# Patient Record
Sex: Female | Born: 1965 | Race: White | Hispanic: No | Marital: Married
Health system: Midwestern US, Community
[De-identification: ages and names within clinical notes are randomized; demographics above are authoritative.]

---

## 2017-06-26 ENCOUNTER — Emergency Department: Admit: 2017-06-27 | Payer: MEDICARE

## 2017-06-26 DIAGNOSIS — R109 Unspecified abdominal pain: Secondary | ICD-10-CM

## 2017-06-26 NOTE — ED Provider Notes (Signed)
Triage Chief Complaint:   Flank Pain (Patient to ED with complaints of left flank pain today. )      HOPI:  Helen Austin is a 51 y.o. female that presents with severe left flank pain.  Patient has a history of kidney stones pain came on rather abruptly.  She is nauseous but not vomiting and not febrile.  Patient did note some pink urine.  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  Symptoms began today.    ROS:  Review of systems was reviewed for 10 systems and is otherwise negative except as in the HOPI    History reviewed. No pertinent past medical history.  History reviewed. No pertinent surgical history.  History reviewed. No pertinent family history.  Social History     Social History   . Marital status: Married     Spouse name: N/A   . Number of children: N/A   . Years of education: N/A     Occupational History   . Not on file.     Social History Main Topics   . Smoking status: Never Smoker   . Smokeless tobacco: Never Used   . Alcohol use Not on file   . Drug use: Unknown   . Sexual activity: Not on file     Other Topics Concern   . Not on file     Social History Narrative   . No narrative on file     Current Facility-Administered Medications   Medication Dose Route Frequency Provider Last Rate Last Dose   . HYDROmorphone (DILAUDID) injection 1 mg  1 mg Intravenous Q30 Min PRN Kerin Salenichard C Stuntz Jr., MD   1 mg at 06/27/17 0037     Current Outpatient Prescriptions   Medication Sig Dispense Refill   . oxyCODONE-acetaminophen (PERCOCET) 5-325 MG per tablet Take 1 tablet by mouth every 6 hours as needed for Pain for up to 3 days. Intended supply: 3 days. Take lowest dose possible to manage pain. 12 tablet 0     Allergies   Allergen Reactions   . Latex    . Demerol Hcl [Meperidine]      Nursing Notes Reviewed    Physical Exam:  ED Triage Vitals   Enc Vitals Group      BP       Pulse       Resp       Temp       Temp src       SpO2       Weight       Height       Head Circumference        Peak Flow       Pain Score       Pain Loc       Pain Edu?       Excl. in GC?        GENERAL APPEARANCE: A well-developed well-nourished Very pleasant uncomfortable 51 year old female in obvious distress  HEAD: Normocephalic, atraumatic  EYES: Sclera anicteric.no conjunctival injection,   HEART: RRR without rubs murmurs or gallops  LUNGS:  Clear good air movement no wheezing no retraction or accessory muscle use,  ABDOMEN: Soft, non-tender to palpation, no guarding or rebound., no mass or distention and no hepatosplenomegaly. Clearly no evidence of an acute abdomen or peritoneal signs at time of exam  GU: Bladder not distended,  no CVA tenderness       SKIN: Warm and  dry. Normal color, no rash,  capillary refill less than 2 seconds  MENTAL STATUS: Alert, oriented, interactive,     Nursing note and vital signs reviewed     I have reviewed and interpreted all of the currently available lab results from this visit (if applicable):  Results for orders placed or performed during the hospital encounter of 06/26/17   CBC Auto Differential   Result Value Ref Range    WBC 8.3 4.0 - 11.0 K/uL    RBC 4.38 4.00 - 5.20 M/uL    Hemoglobin 14.2 12.0 - 16.0 g/dL    Hematocrit 16.1 09.6 - 48.0 %    MCV 95.4 80.0 - 100.0 fL    MCH 32.5 26.0 - 34.0 pg    MCHC 34.1 31.0 - 36.0 g/dL    RDW 04.5 40.9 - 81.1 %    Platelets 256 135 - 450 K/uL    MPV 7.6 5.0 - 10.5 fL    Neutrophils % 53.1 %    Lymphocytes % 35.2 %    Monocytes % 9.8 %    Eosinophils % 1.0 %    Basophils % 0.9 %    Neutrophils # 4.4 1.7 - 7.7 K/uL    Lymphocytes # 2.9 1.0 - 5.1 K/uL    Monocytes # 0.8 0.0 - 1.3 K/uL    Eosinophils # 0.1 0.0 - 0.6 K/uL    Basophils # 0.1 0.0 - 0.2 K/uL   Comprehensive Metabolic Panel   Result Value Ref Range    Sodium 136 136 - 145 mmol/L    Potassium 4.0 3.5 - 5.1 mmol/L    Chloride 103 99 - 110 mmol/L    CO2 24 21 - 32 mmol/L    Anion Gap 9 3 - 16    Glucose 67 (L) 70 - 99 mg/dL    BUN 11 7 - 20 mg/dL    CREATININE 0.6 0.6 - 1.1 mg/dL     GFR Non-African American >60 >60    GFR African American >60 >60    Calcium 10.1 8.3 - 10.6 mg/dL    Total Protein 7.1 6.4 - 8.2 g/dL    Alb 4.1 3.4 - 5.0 g/dL    Albumin/Globulin Ratio 1.4 1.1 - 2.2    Total Bilirubin 0.4 0.0 - 1.0 mg/dL    Alkaline Phosphatase 68 40 - 129 U/L    ALT 27 10 - 40 U/L    AST 27 15 - 37 U/L    Globulin 3.0 g/dL   Urinalysis Reflex to Culture   Result Value Ref Range    Color, UA Yellow Straw/Yellow    Clarity, UA SL CLOUDY (A) Clear    Glucose, Ur Negative Negative mg/dL    Bilirubin Urine Negative Negative    Ketones, Urine TRACE (A) Negative mg/dL    Specific Gravity, UA 1.025 1.005 - 1.030    Blood, Urine SMALL (A) Negative    pH, UA 6.0 5.0 - 8.0    Protein, UA Negative Negative mg/dL    Urobilinogen, Urine 0.2 <2.0 E.U./dL    Nitrite, Urine Negative Negative    Leukocyte Esterase, Urine Negative Negative    Microscopic Examination YES     Urine Reflex to Culture Yes     Urine Type Not Specified    Microscopic Urinalysis   Result Value Ref Range    Mucus, UA 1+ (A) /LPF    WBC, UA 6-10 (A) 0 - 5 /HPF    RBC, UA 5-10 (A)  0 - 2 /HPF    Epi Cells 3-5 /HPF    Bacteria, UA Rare (A) /HPF    Crystals 1+ Ca. Oxalate (A) /HPF        Radiographs (if obtained):  []  Radiologist's Report Reviewed:  CT ABDOMEN PELVIS WO CONTRAST   Final Result   Bilateral nephrolithiasis, without evidence of an obstructive uropathy.             []  Discussed with Radiologist:     []  The following radiograph was interpreted by myself in the absence of a radiologist:     EKG (if obtained): (All EKG's are interpreted by myself in the absence of a cardiologist)      MDM:   Patient with flank pain presents to the ER for evaluation.  She has had kidney stones in the past and may have passed a stone on this occasion.  There is no ureteral calculi noted on CT scan but she has bilateral intrarenal stones.  Her pain resolved in the emergency department with medication she'll be treated as an outpatient with Percocet for  pain and followed up with urology.  In the interim however if problems develop she is to return to the emergency department for further evaluation.  Patient and her husband are reliable and understand these instructions     Final Impression:  1. Flank pain        Critical Care:       Disposition referral (if applicable):  No follow-up provider specified.    Disposition medications (if applicable):  New Prescriptions    OXYCODONE-ACETAMINOPHEN (PERCOCET) 5-325 MG PER TABLET    Take 1 tablet by mouth every 6 hours as needed for Pain for up to 3 days. Intended supply: 3 days. Take lowest dose possible to manage pain.       Comment: Please note this report has been produced using speech recognition software and may contain errors related to that system including errors in grammar, punctuation, and spelling, as well as words and phrases that may be inappropriate. If there are any questions or concerns please feel free to contact the dictating provider for clarification.      (Please note that portions of this note may have been completed with a voice recognition program. Efforts were made to edit the dictations but occasionally words are mis-transcribed.)    Kerin Salenichard C Stuntz, Jr, MD           Kerin Salenichard C Stuntz Jr., MD  06/27/17 [phone]

## 2017-06-27 ENCOUNTER — Inpatient Hospital Stay
Admit: 2017-06-27 | Discharge: 2017-06-27 | Disposition: A | Discharge status: Home / Self Care | Payer: MEDICARE | Attending: Emergency Medicine

## 2017-06-27 LAB — COMPREHENSIVE METABOLIC PANEL
ALT: 27 U/L (ref 10–40)
AST: 27 U/L (ref 15–37)
Albumin/Globulin Ratio: 1.4 (ref 1.1–2.2)
Albumin: 4.1 g/dL (ref 3.4–5.0)
Alkaline Phosphatase: 68 U/L (ref 40–129)
Anion Gap: 9 (ref 3–16)
BUN: 11 mg/dL (ref 7–20)
CO2: 24 mmol/L (ref 21–32)
Calcium: 10.1 mg/dL (ref 8.3–10.6)
Chloride: 103 mmol/L (ref 99–110)
Creatinine: 0.6 mg/dL (ref 0.6–1.1)
GFR African American: >60 (ref >60)
GFR Non-African American: >60 (ref >60)
Globulin: 3 g/dL
Glucose: 67 mg/dL — ABNORMAL LOW (ref 70–99)
Potassium: 4 mmol/L (ref 3.5–5.1)
Sodium: 136 mmol/L (ref 136–145)
Total Bilirubin: 0.4 mg/dL (ref 0.0–1.0)
Total Protein: 7.1 g/dL (ref 6.4–8.2)

## 2017-06-27 LAB — URINALYSIS WITH REFLEX TO CULTURE
Bilirubin Urine: NEGATIVE
Glucose, Ur: NEGATIVE mg/dL
Leukocyte Esterase, Urine: NEGATIVE
Nitrite, Urine: NEGATIVE
Protein, UA: NEGATIVE mg/dL
Specific Gravity, UA: 1.025 (ref 1.005–1.030)
Urobilinogen, Urine: 0.2 E.U./dL (ref <2.0)
pH, UA: 6 (ref 5.0–8.0)

## 2017-06-27 LAB — CBC WITH AUTO DIFFERENTIAL
Basophils %: 0.9 %
Basophils Absolute: 0.1 10*3/uL (ref 0.0–0.2)
Eosinophils %: 1 %
Eosinophils Absolute: 0.1 10*3/uL (ref 0.0–0.6)
Hematocrit: 41.8 % (ref 36.0–48.0)
Hemoglobin: 14.2 g/dL (ref 12.0–16.0)
Lymphocytes %: 35.2 %
Lymphocytes Absolute: 2.9 10*3/uL (ref 1.0–5.1)
MCH: 32.5 pg (ref 26.0–34.0)
MCHC: 34.1 g/dL (ref 31.0–36.0)
MCV: 95.4 fL (ref 80.0–100.0)
MPV: 7.6 fL (ref 5.0–10.5)
Monocytes %: 9.8 %
Monocytes Absolute: 0.8 10*3/uL (ref 0.0–1.3)
Neutrophils %: 53.1 %
Neutrophils Absolute: 4.4 10*3/uL (ref 1.7–7.7)
Platelets: 256 10*3/uL (ref 135–450)
RBC: 4.38 M/uL (ref 4.00–5.20)
RDW: 13 % (ref 12.4–15.4)
WBC: 8.3 10*3/uL (ref 4.0–11.0)

## 2017-06-27 LAB — MICROSCOPIC URINALYSIS

## 2017-06-27 MED ORDER — OXYCODONE-ACETAMINOPHEN 5-325 MG PO TABS
5-325 MG | ORAL_TABLET | Freq: Four times a day (QID) | ORAL | 0 refills | Status: AC | PRN
Start: 2017-06-27 — End: 2017-06-30

## 2017-06-27 MED ORDER — HYDROMORPHONE HCL 2 MG/ML IJ SOLN
2 MG/ML | INTRAMUSCULAR | Status: DC | PRN
Start: 2017-06-27 — End: 2017-06-27
  Administered 2017-06-27 (×2): 1 mg via INTRAVENOUS

## 2017-06-27 MED ORDER — ONDANSETRON HCL 4 MG/2ML IJ SOLN
4 MG/2ML | Freq: Once | INTRAMUSCULAR | Status: AC
Start: 2017-06-27 — End: 2017-06-26
  Administered 2017-06-27: 05:00:00 4 mg via INTRAVENOUS

## 2017-06-27 MED FILL — ONDANSETRON HCL 4 MG/2ML IJ SOLN: 4 MG/2ML | INTRAMUSCULAR | Qty: 2

## 2017-06-27 MED FILL — HYDROMORPHONE HCL 2 MG/ML IJ SOLN: 2 mg/mL | INTRAMUSCULAR | Qty: 1

## 2017-06-27 NOTE — Discharge Instructions (Signed)
Follow-up with urology  Percocet for pain  Return to the emergency department if problems develop like worsening pain vomiting fever or other concerning symptoms

## 2017-06-27 NOTE — ED Notes (Signed)
Saline lock removed intact. IV site looked unremarkable. Pressure dressing applied.     Discharge instructions reviewed with Helen Austin. She verbalized understanding. Copy of discharge instructions and prescriptions given.     She was advised not to drive, drink ETOH, operate machinery, or supervise small children after receiving pain medications here in the ED or while taking pain medications at home. She verbalized understanding.     Helen Austin was discharged to home in good condition per personal vehicle, friend driving. She exited the ED without difficulty.      Barbette ReichmannSarah Burt Piatek, RN  06/27/17 [phone]

## 2017-06-27 NOTE — ED Notes (Signed)
Received report from dave rn.     Robb Matarourtney P Jearld Hemp, RN  06/27/17 [phone]

## 2017-06-28 LAB — CULTURE, URINE: Urine Culture, Routine: NO GROWTH

## 2019-01-04 ENCOUNTER — Emergency Department (HOSPITAL_COMMUNITY): Payer: Medicare Other

## 2019-01-04 ENCOUNTER — Other Ambulatory Visit: Payer: Self-pay

## 2019-01-04 ENCOUNTER — Emergency Department (HOSPITAL_COMMUNITY)
Admission: EM | Admit: 2019-01-04 | Discharge: 2019-01-04 | Disposition: A | Discharge status: Home / Self Care | Payer: Medicare Other | Attending: Emergency Medicine | Admitting: Emergency Medicine

## 2019-01-04 DIAGNOSIS — F444 Conversion disorder with motor symptom or deficit: Secondary | ICD-10-CM

## 2019-01-04 DIAGNOSIS — E876 Hypokalemia: Secondary | ICD-10-CM | POA: Insufficient documentation

## 2019-01-04 DIAGNOSIS — R4781 Slurred speech: Secondary | ICD-10-CM | POA: Insufficient documentation

## 2019-01-04 DIAGNOSIS — R531 Weakness: Secondary | ICD-10-CM | POA: Diagnosis not present

## 2019-01-04 DIAGNOSIS — Z79899 Other long term (current) drug therapy: Secondary | ICD-10-CM | POA: Diagnosis not present

## 2019-01-04 DIAGNOSIS — R55 Syncope and collapse: Secondary | ICD-10-CM | POA: Insufficient documentation

## 2019-01-04 DIAGNOSIS — F54 Psychological and behavioral factors associated with disorders or diseases classified elsewhere: Secondary | ICD-10-CM | POA: Diagnosis not present

## 2019-01-04 DIAGNOSIS — Z8673 Personal history of transient ischemic attack (TIA), and cerebral infarction without residual deficits: Secondary | ICD-10-CM | POA: Insufficient documentation

## 2019-01-04 DIAGNOSIS — Z7982 Long term (current) use of aspirin: Secondary | ICD-10-CM | POA: Insufficient documentation

## 2019-01-04 LAB — PROTIME-INR
INR: 1 (ref 0.8–1.2)
Prothrombin Time: 13.5 seconds (ref 11.4–15.2)

## 2019-01-04 LAB — CBC
HCT: 38.8 % (ref 36.0–46.0)
Hemoglobin: 13.1 g/dL (ref 12.0–15.0)
MCH: 31 pg (ref 26.0–34.0)
MCHC: 33.8 g/dL (ref 30.0–36.0)
MCV: 91.7 fL (ref 80.0–100.0)
Platelets: 244 10*3/uL (ref 150–400)
RBC: 4.23 MIL/uL (ref 3.87–5.11)
RDW: 12.3 % (ref 11.5–15.5)
WBC: 5.8 10*3/uL (ref 4.0–10.5)
nRBC: 0 % (ref 0.0–0.2)

## 2019-01-04 LAB — I-STAT BETA HCG BLOOD, ED (MC, WL, AP ONLY): I-stat hCG, quantitative: 9.2 m[IU]/mL — ABNORMAL HIGH (ref <5)

## 2019-01-04 LAB — COMPREHENSIVE METABOLIC PANEL
ALT: 15 U/L (ref 0–44)
AST: 24 U/L (ref 15–41)
Albumin: 4.1 g/dL (ref 3.5–5.0)
Alkaline Phosphatase: 57 U/L (ref 38–126)
Anion gap: 13 (ref 5–15)
BUN: 9 mg/dL (ref 6–20)
CO2: 22 mmol/L (ref 22–32)
Calcium: 10.2 mg/dL (ref 8.9–10.3)
Chloride: 107 mmol/L (ref 98–111)
Creatinine, Ser: 0.84 mg/dL (ref 0.44–1.00)
GFR calc Af Amer: >60 mL/min (ref >60)
GFR calc non Af Amer: >60 mL/min (ref >60)
Glucose, Bld: 107 mg/dL — ABNORMAL HIGH (ref 70–99)
Potassium: 2.8 mmol/L — ABNORMAL LOW (ref 3.5–5.1)
Sodium: 142 mmol/L (ref 135–145)
Total Bilirubin: 1.2 mg/dL (ref 0.3–1.2)
Total Protein: 6.8 g/dL (ref 6.5–8.1)

## 2019-01-04 LAB — I-STAT CHEM 8, ED
BUN: 10 mg/dL (ref 6–20)
Calcium, Ion: 1.16 mmol/L (ref 1.15–1.40)
Chloride: 109 mmol/L (ref 98–111)
Creatinine, Ser: 0.8 mg/dL (ref 0.44–1.00)
Glucose, Bld: 105 mg/dL — ABNORMAL HIGH (ref 70–99)
HCT: 38 % (ref 36.0–46.0)
Hemoglobin: 12.9 g/dL (ref 12.0–15.0)
Potassium: 2.7 mmol/L — CL (ref 3.5–5.1)
Sodium: 141 mmol/L (ref 135–145)
TCO2: 23 mmol/L (ref 22–32)

## 2019-01-04 LAB — DIFFERENTIAL
Abs Immature Granulocytes: 0.02 10*3/uL (ref 0.00–0.07)
Basophils Absolute: 0 10*3/uL (ref 0.0–0.1)
Basophils Relative: 0 %
Eosinophils Absolute: 0.1 10*3/uL (ref 0.0–0.5)
Eosinophils Relative: 1 %
Immature Granulocytes: 0 %
Lymphocytes Relative: 35 %
Lymphs Abs: 2.1 10*3/uL (ref 0.7–4.0)
Monocytes Absolute: 0.6 10*3/uL (ref 0.1–1.0)
Monocytes Relative: 10 %
Neutro Abs: 3.1 10*3/uL (ref 1.7–7.7)
Neutrophils Relative %: 54 %

## 2019-01-04 LAB — CBG MONITORING, ED: Glucose-Capillary: 103 mg/dL — ABNORMAL HIGH (ref 70–99)

## 2019-01-04 LAB — APTT: aPTT: 24 seconds (ref 24–36)

## 2019-01-04 LAB — MAGNESIUM: Magnesium: 1.9 mg/dL (ref 1.7–2.4)

## 2019-01-04 MED ORDER — ACETAMINOPHEN 325 MG PO TABS
325.0000 mg | ORAL_TABLET | Freq: Once | ORAL | Status: DC
  Filled 2019-01-04: qty 1

## 2019-01-04 MED ORDER — ACETAMINOPHEN 325 MG PO TABS
650.0000 mg | ORAL_TABLET | Freq: Once | ORAL | Status: AC
  Administered 2019-01-04: 650 mg via ORAL

## 2019-01-04 MED ORDER — SODIUM CHLORIDE 0.9% FLUSH: 3.0000 mL | Freq: Once | INTRAVENOUS | Status: DC

## 2019-01-04 MED ORDER — POTASSIUM CHLORIDE CRYS ER 20 MEQ PO TBCR
40.0000 meq | EXTENDED_RELEASE_TABLET | Freq: Once | ORAL | Status: AC
  Administered 2019-01-04: 40 meq via ORAL
  Filled 2019-01-04: qty 2

## 2019-01-04 MED ORDER — MIDAZOLAM HCL 2 MG/2ML IJ SOLN
2.0000 mg | Freq: Once | INTRAMUSCULAR | Status: DC
  Administered 2019-01-04: 1 mg via INTRAVENOUS
  Filled 2019-01-04: qty 2

## 2019-01-04 MED ORDER — POTASSIUM CHLORIDE 10 MEQ/100ML IV SOLN
10.0000 meq | INTRAVENOUS | Status: AC
  Administered 2019-01-04 (×2): 10 meq via INTRAVENOUS
  Filled 2019-01-04 (×2): qty 100

## 2019-01-04 NOTE — Progress Notes (Signed)
Pt was awake and alert when I arrived. She was very tearful - sobbing. Her sister had passed away a few hours earlier. She wanted to know if her sister was here. She wanted to know if her sister had really died. Her speech was difficult to understand. However, she asked for prayer. Her husband arrived later and confirmed her sister was deceased. He said pt had stopped breathing and he gave her mouth to mouth resuscitation and then she started breathing again meanwhile an abumlance was called. Pt said she does not remember being there when her sister passed. Pt was much calmer when her husband was bedside and not as tearful. He was very attentive and supportive of her needs. Please page if additional assistance is needed. [place], MDiv   01/04/19 1139  Clinical Encounter Type  Visited With Patient;Patient and family together

## 2019-01-04 NOTE — Progress Notes (Signed)
CSW contacted patient's husband, Jeneen Rinks to let him know patient was ready to be picked up. Upon calling Jeneen Rinks, he reports he was at [facility] his wife and was back in the room with her. He states they are awaiting patient to be discharged. No other social work needs stated. CSW signing off.   [place]en Circle, LCSW Transitions of Care Department [facility] ED [phone]

## 2019-01-04 NOTE — ED Notes (Signed)
Patient ambulated in room, patient tolerated well. Back in bed resting comfortably.

## 2019-01-04 NOTE — ED Notes (Addendum)
Attempted to walk patient, upon standing she became very dizzy and complained of increased head pressure in the frontal lobe. Charge RN aware.

## 2019-01-04 NOTE — ED Provider Notes (Addendum)
[facility] Provider Note   CSN: [id number] Arrival date & time: 01/04/19  [id number]  An emergency department physician performed an initial assessment on this suspected stroke patient at 0237.  History   Chief Complaint Chief Complaint  Patient presents with   Code Stroke    HPI Natalie Martin is a 53 y.o. female.     HPI  53 year old female with prior history of stroke brought into the ER with chief complaint of slurred speech and left-sided weakness.  Level 5 caveat for altered mental status.  According to the EMS staff, patient's sister recently passed away and patient has been under a lot of stress because of that.  EMS was called after patient had a syncopal episode followed by her waking up with slurred speech and family noticing left-sided weakness when she tried to get up.  Patient also had facial drooping.  She has history of PTSD.  Patient has no known medical history.  She has had a stroke of some kind in the past with no residual side effects  No past medical history on file.  There are no active problems to display for this patient.      OB History   No obstetric history on file.      Home Medications    Prior to Admission medications   Medication Sig Start Date End Date Taking? Authorizing Provider  ALPRAZolam Prudy Feeler(XANAX) 1 MG tablet Take 0.5-1 mg by mouth 3 (three) times daily.   Yes [provider]  aspirin EC 81 MG tablet Take 81 mg by mouth daily.   Yes [provider]  atorvastatin (LIPITOR) 40 MG tablet Take 40 mg by mouth daily.   Yes [provider]  clonazePAM (KLONOPIN) 1 MG tablet Take 0.5-1 mg by mouth 3 (three) times daily as needed for anxiety.   Yes [provider]  dexlansoprazole (DEXILANT) 60 MG capsule Take 60 mg by mouth daily.   Yes [provider]  doxycycline (VIBRAMYCIN) 100 MG capsule Take 100 mg by mouth daily.   Yes [provider]    ondansetron (ZOFRAN-ODT) 4 MG disintegrating tablet Take 4 mg by mouth every 8 (eight) hours as needed for nausea or vomiting.   Yes [provider]  venlafaxine XR (EFFEXOR-XR) 150 MG 24 hr capsule Take 150 mg by mouth daily with breakfast.   Yes [provider]    Family History No family history on file.  Social History Social History   Tobacco Use   Smoking status: Not on file  Substance Use Topics   Alcohol use: Not on file   Drug use: Not on file     Allergies   Patient has no allergy information on record.   Review of Systems Review of Systems  Unable to perform ROS: Mental status change     Physical Exam Updated Vital Signs BP (!) 160/80 (BP Location: Left Arm)    Pulse 80    Temp 98.2 F (36.8 C) (Oral)    Resp 17    Wt 73.3 kg    SpO2 94%   Physical Exam Vitals signs and nursing note reviewed.  Constitutional:      Appearance: She is well-developed.  HENT:     Head: Normocephalic and atraumatic.  Eyes:     Extraocular Movements: Extraocular movements intact.     Pupils: Pupils are equal, round, and reactive to light.  Neck:     Musculoskeletal: Normal range of motion and neck  supple.  Cardiovascular:     Rate and Rhythm: Normal rate.     Heart sounds: Normal heart sounds.  Pulmonary:     Effort: Pulmonary effort is normal. No respiratory distress.     Breath sounds: Normal breath sounds.  Abdominal:     General: Bowel sounds are normal. There is no distension.     Palpations: Abdomen is soft.     Tenderness: There is no abdominal tenderness. There is no guarding or rebound.  Skin:    General: Skin is warm and dry.  Neurological:     Mental Status: She is alert.     Comments: Patient is not oriented to location. On my exam she has left-sided hemiplegia with mild drooping.  She is having difficulty expressing herself, and getting the right words out.  She is also slurring, but it could be effort related      ED Treatments  / Results  Labs (all labs ordered are listed, but only abnormal results are displayed) Labs Reviewed  COMPREHENSIVE METABOLIC PANEL - Abnormal; Notable for the following components:      Result Value   Potassium 2.8 (*)    Glucose, Bld 107 (*)    All other components within normal limits  I-STAT CHEM 8, ED - Abnormal; Notable for the following components:   Potassium 2.7 (*)    Glucose, Bld 105 (*)    All other components within normal limits  CBG MONITORING, ED - Abnormal; Notable for the following components:   Glucose-Capillary 103 (*)    All other components within normal limits  I-STAT BETA HCG BLOOD, ED ([facility], [facility], [facility] ONLY) - Abnormal; Notable for the following components:   I-stat hCG, quantitative 9.2 (*)    All other components within normal limits  PROTIME-INR  APTT  CBC  DIFFERENTIAL  MAGNESIUM    EKG EKG Interpretation  Date/Time:  Thursday January 04 2019 03:52:36 EDT Ventricular Rate:  73 PR Interval:    QRS Duration: 105 QT Interval:  415 QTC Calculation: 458 R Axis:   12 Text Interpretation:  Sinus rhythm Abnormal R-wave progression, early transition No acute changes No significant change since last tracing Confirmed by Derwood Kaplananavati, Philana Younis ([id number]) on 01/04/2019 5:16:16 AM   Radiology Mr Brain Wo Contrast  Result Date: 01/04/2019 CLINICAL DATA:  Initial evaluation for acute stroke, left-sided deficits. EXAM: MRI HEAD WITHOUT CONTRAST TECHNIQUE: Multiplanar, multiecho pulse sequences of the brain and surrounding structures were obtained without intravenous contrast. COMPARISON:  Prior CT from earlier the same day. FINDINGS: Brain: Cerebral volume within normal limits for patient age. Few small remote lacunar infarcts noted within the right basal ganglia and right thalamus. No abnormal foci of restricted diffusion to suggest acute or subacute ischemia. Gray-white matter differentiation well maintained. No encephalomalacia to suggest chronic infarction. No foci of  susceptibility artifact to suggest acute or chronic intracranial hemorrhage. No mass lesion, midline shift or mass effect. No hydrocephalus. No extra-axial fluid collection. Major dural sinuses are grossly patent. 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 Midline structures intact and normal. Vascular: Major intracranial vascular flow voids well maintained and normal in appearance. Skull and upper cervical spine: Craniocervical junction normal. Visualized upper cervical spine within normal limits. Bone marrow signal intensity normal. No scalp soft tissue abnormality. Sinuses/Orbits: Globes and orbital soft tissues within normal limits. Paranasal sinuses are clear. No mastoid effusion. Inner ear structures normal. Other: None. IMPRESSION: 1. No acute intracranial infarct or other abnormality identified. 2. 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 Electronically Signed   By: Jeannine Boga M.D.   On: 01/04/2019 03:42   Ct Head Code Stroke Wo Contrast  Result Date: 01/04/2019 CLINICAL DATA:  Code stroke. Initial evaluation for acute left-sided deficits. EXAM: CT HEAD WITHOUT CONTRAST TECHNIQUE: Contiguous axial images were obtained from the base of the skull through the vertex without intravenous contrast. COMPARISON:  None. FINDINGS: Brain: Cerebral volume within normal limits. Few small remote lacunar infarcts noted involving the right basal ganglia. No acute intracranial hemorrhage. No acute large vessel territory infarct. No mass lesion, midline shift or mass effect. No hydrocephalus. No extra-axial fluid collection. Vascular: No hyperdense vessel. Skull: Scalp soft tissues and calvarium within normal limits. Sinuses/Orbits: Globes and orbital soft tissues normal. Paranasal sinuses and mastoid air cells are clear. Other: None. ASPECTS [facility] Stroke Program Early CT Score) - Ganglionic level infarction (caudate, lentiform nuclei, internal capsule,  insula, M1-M3 cortex): 7 - Supraganglionic infarction (M4-M6 cortex): 3 Total score (0-10 with 10 being normal): 10 IMPRESSION: 1. No acute intracranial infarct or other abnormality identified. 2. ASPECTS is 10. 3. Few small remote right basal ganglia lacunar infarcts. These results were communicated to Dr. Lorraine Lax at [place] 7/9/2020by text page via the [facility] messaging system. Electronically Signed   By: Jeannine Boga M.D.   On: 01/04/2019 03:14    Procedures .Critical Care Performed by: Varney Biles, MD Authorized by: Varney Biles, MD   Critical care provider statement:    Critical care time (minutes):  32   Critical care was necessary to treat or prevent imminent or life-threatening deterioration of the following conditions:  CNS failure or compromise   Critical care was time spent personally by me on the following activities:  Discussions with consultants, evaluation of patient's response to treatment, examination of patient, ordering and performing treatments and interventions, ordering and review of laboratory studies, ordering and review of radiographic studies, pulse oximetry, re-evaluation of patient's condition, obtaining history from patient or surrogate and review of old charts   (including critical care time)  Medications Ordered in ED Medications  sodium chloride flush (NS) 0.9 % injection 3 mL (has no administration in time range)  potassium chloride 10 mEq in 100 mL IVPB (10 mEq Intravenous New Bag/Given 01/04/19 0435)  midazolam (VERSED) injection 2 mg (has no administration in time range)     Initial Impression / Assessment and Plan / ED Course  I have reviewed the triage vital signs and the nursing notes.  Pertinent labs & imaging results that were available during my care of the patient were reviewed by me and considered in my medical decision making (see chart for details).  Clinical Course as of Jan 03 521  Thu Jan 04, 2019  0516 Patient reassessed  after the MRI.  She continues to have slurring of her speech and difficulty getting the right words out.  I did notice that she is moving her left upper extremity more freely. With the MRI negative, this appears to be stress related.  I called the phone number listed as contact for her husband with the area code [phone], however no one is picking up the call. She is hypokalemic.     [AN]  4540 Spoke with the hospitalist.  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  Medicine team is hoping that we can give patient more time to see if she gets up and moves around.  We will encourage her to walk,  give her some Versed and consult social work as well.   [AN]    Clinical Course User Index [AN] Derwood KaplanNanavati, Luciano Cinquemani, MD       53 year old female with history of stroke with no residual side effects from it comes in with chief complaint of left-sided weakness and slurring of the speech.  She had a fainting spell that lasted for couple of minutes followed by weakness on the left side and slurring of the speech.  Code stroke was activated.  Although stroke is possible, it appears to be that the symptoms were likely stress mediated or psychogenic.  Neuro team is at the bedside.  They want an emergent MRI to rule out a large stroke given her pronounced symptoms.    Final Clinical Impressions(s) / ED Diagnoses   Final diagnoses:  Stress-related physiological response affecting medical condition  Acute hypokalemia  Syncope, unspecified syncope type    ED Discharge Orders    None           Derwood KaplanNanavati, Arjay Jaskiewicz, MD 01/04/19 [phone]

## 2019-01-04 NOTE — ED Provider Notes (Signed)
Patient signed to me by Dr. Kathrynn Humble.  States she feels that she is back to her baseline.  Will discharge home   Lacretia Leigh, MD 01/04/19 1302

## 2019-01-04 NOTE — Consult Note (Signed)
Requesting Physician: Dr. Rhunette CroftNanavati    Chief Complaint: Syncope, left side weakness  History obtained from: Patient and Chart   HPI:                                                                                                                                       Natalie Martin is a 53 y.o. female with past medical history of anxiety, PTSD, prior stroke presents to the emergency department as a stroke alert for left-sided weakness.  Patient's last known normal was 1:30 AM today.  She lives in [place] and was visiting her sister who is in hospice.  Patient was witnessed to collapse by the hospice nurse who called EMS.  When EMS arrived patient was not moving the left arm or left leg and would not look to the left side.  Stroke alert was initiated and patient was brought to the ER.  On assessment, the patient was not very cooperative on examination and was stuttering her speech/forcibly closing her mouth.  She was also pounding in her chest.  A stat CT head was obtained which was negative for acute findings and patient went a stat MRI brain which ruled out an acute stroke.  Patient nods her head her left her propofol-sided weakness is happened before and usually resolves.  Date last known well: 01/04/19 Time last known well:  tPA Given: no, MRI negative for stroke NIHSS: 14 14 Baseline MRS 0  Past medical history as noted above   No family history on file. Social History:  has no history on file for tobacco, alcohol, and drug.  Allergies: Not on File  Medications:                                                                                                                        I reviewed home medications, not on any blood thinners   ROS:  14 systems reviewed and negative except above   Examination:                                                                                                       General: Appears well-developed  Psych: Anxious and tearful Eyes: No scleral injection HENT: No OP obstrucion Head: Normocephalic.  Cardiovascular: Normal rate and regular rhythm.  Respiratory: Effort normal and breath sounds normal to anterior ascultation GI: Soft.  No distension. There is no tenderness.  Skin: WDI    Neurological Examination Mental Status: Patient is awake, attempting to speak with mouth closed. Answers questions, follows commands. Cranial Nerves: II: Visual fields blinks to threat bilaterally III,IV, VI: ptosis not present, extra-ocular motions intact bilaterally, pupils equal, round, reactive to light and accommodation V,VII: No obvious facial droop when patient not moving her mouth, however while speaking patient moves only the right side of her mouth. VIII: hearing normal bilaterally IX,X: uvula rises symmetrically XI: bilateral shoulder shrug XII: midline tongue extension Motor: Right upper and right lower extremity have 5 out of 5 strength.  Initially would not move left upper and lower extremity to command however at times patient would sustain left arm when distracted.  Has a least 3+/ 5 out of strength in bilateral both upper and lower extremities on the left side. Tone and bulk:normal tone throughout; no atrophy noted Sensory: Pinprick and light touch intact throughout, bilaterally Deep Tendon Reflexes: 2+ and symmetric throughout Plantars: Right: downgoing   Left: downgoing Cerebellar: No gross ataxia noted      Lab Results: Basic Metabolic Panel: No results for input(s): NA, K, CL, CO2, GLUCOSE, BUN, CREATININE, CALCIUM, MG, PHOS in the last 168 hours.  CBC: No results for input(s): WBC, NEUTROABS, HGB, HCT, MCV, PLT in the last 168 hours.  Coagulation Studies: No results for input(s): LABPROT, INR in the last 72 hours.  Imaging: Mr Brain Wo Contrast  Result Date:  01/04/2019 CLINICAL DATA:  Initial evaluation for acute stroke, left-sided deficits. EXAM: MRI HEAD WITHOUT CONTRAST TECHNIQUE: Multiplanar, multiecho pulse sequences of the brain and surrounding structures were obtained without intravenous contrast. COMPARISON:  Prior CT from earlier the same day. FINDINGS: Brain: Cerebral volume within normal limits for patient age. Few small remote lacunar infarcts noted within the right basal ganglia and right thalamus. No abnormal foci of restricted diffusion to suggest acute or subacute ischemia. Gray-white matter differentiation well maintained. No encephalomalacia to suggest chronic infarction. No foci of susceptibility artifact to suggest acute or chronic intracranial hemorrhage. No mass lesion, midline shift or mass effect. No hydrocephalus. No extra-axial fluid collection. Major dural sinuses are grossly patent. 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 Midline structures intact and normal. Vascular: Major intracranial vascular flow voids well maintained and normal in appearance. Skull and upper cervical spine: Craniocervical junction normal. Visualized upper cervical spine within normal limits. Bone marrow signal intensity normal. No scalp soft tissue abnormality. Sinuses/Orbits: Globes and orbital soft tissues within normal limits. Paranasal sinuses are clear. No mastoid effusion. Inner ear structures normal. Other: None. IMPRESSION: 1.  No acute intracranial infarct or other abnormality identified. 2. 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 Electronically Signed   By: Jeannine Boga M.D.   On: 01/04/2019 03:42   Ct Head Code Stroke Wo Contrast  Result Date: 01/04/2019 CLINICAL DATA:  Code stroke. Initial evaluation for acute left-sided deficits. EXAM: CT HEAD WITHOUT CONTRAST TECHNIQUE: Contiguous axial images were obtained from the base of the skull through the vertex without intravenous contrast.  COMPARISON:  None. FINDINGS: Brain: Cerebral volume within normal limits. Few small remote lacunar infarcts noted involving the right basal ganglia. No acute intracranial hemorrhage. No acute large vessel territory infarct. No mass lesion, midline shift or mass effect. No hydrocephalus. No extra-axial fluid collection. Vascular: No hyperdense vessel. Skull: Scalp soft tissues and calvarium within normal limits. Sinuses/Orbits: Globes and orbital soft tissues normal. Paranasal sinuses and mastoid air cells are clear. Other: None. ASPECTS [facility] Stroke Program Early CT Score) - Ganglionic level infarction (caudate, lentiform nuclei, internal capsule, insula, M1-M3 cortex): 7 - Supraganglionic infarction (M4-M6 cortex): 3 Total score (0-10 with 10 being normal): 10 IMPRESSION: 1. No acute intracranial infarct or other abnormality identified. 2. ASPECTS is 10. 3. Few small remote right basal ganglia lacunar infarcts. These results were communicated to Dr. Lorraine Lax at [place] 7/9/2020by text page via the [facility] messaging system. Electronically Signed   By: Jeannine Boga M.D.   On: 01/04/2019 03:14     ASSESSMENT AND PLAN  53 year old female with past medical history of anxiety, PTSD, prior stroke presents with syncopal event followed by left-sided weakness.  However exam appear to be more functional, with facial drooping more voluntary.  No effort on examination, however would move her left arm and sustained gravity when distracted.    Patient also anxious and tearful and this crying about her sister.  MRI Brain negative for acute stroke.  Impression:  Conversion disorder  Neurology will be available as needed. Chest pain and anxiety management per EDP  Natalie Martin [facility] Pager Number [phone]

## 2019-01-04 NOTE — ED Notes (Signed)
Previous tech unsuccessful with blood draw

## 2019-01-04 NOTE — ED Notes (Signed)
Ambulated with 97-99% spo2

## 2019-01-04 NOTE — ED Triage Notes (Signed)
Arrived via ems; reported a recent pt sister passed away and pt had a syncopal episode; pt was unconscious x 2 mins. Reported slurred speech upon waking up along w/ left sided weakness and facial droop; reported hx of stroke w/o residual. Hx ofPTSD as well .

## 2020-12-03 IMAGING — MR MRI HEAD WITHOUT CONTRAST
13 series · 48 of 48 positions shown · non-contrast
Comparison: Prior CT from earlier the same day.

CLINICAL DATA: Initial evaluation for acute stroke, left-sided
deficits.

EXAM:
MRI HEAD WITHOUT CONTRAST
TECHNIQUE: Multiplanar, multiecho pulse sequences of the brain and surrounding
structures were obtained without intravenous contrast.

[Series 5: DWI · axial · 3.0mm · 0.88mm/px · z∈[-62,+81]mm · 7 of 98 slices shown (1 of 4)]
[im 1/98]
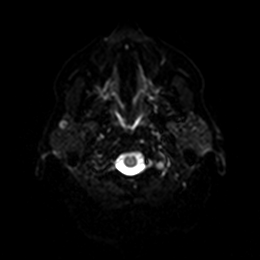
[im 17/98]
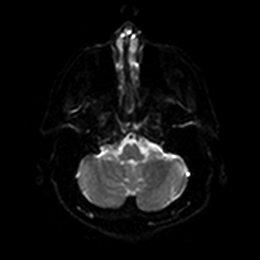
[im 33/98]
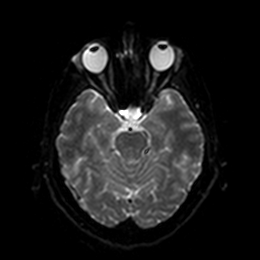
[im 49/98]
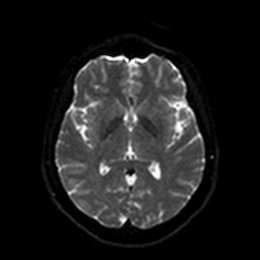
[im 65/98]
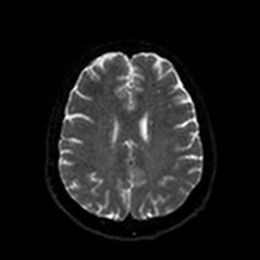
[im 81/98]
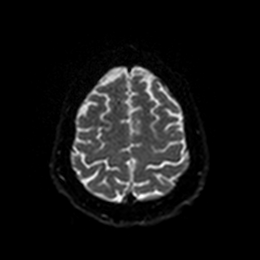
[im 98/98]
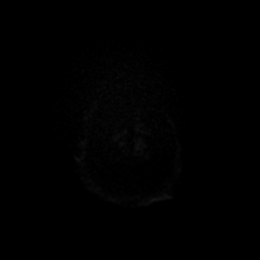

[Series 6: DWI · axial · 3.0mm · 0.88mm/px · z∈[-62,+81]mm · 4 of 49 slices shown (2 of 4)]
[im 1/49]
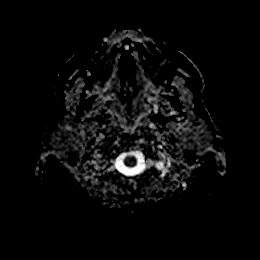
[im 17/49]
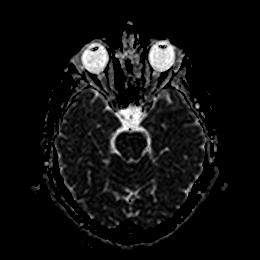
[im 33/49]
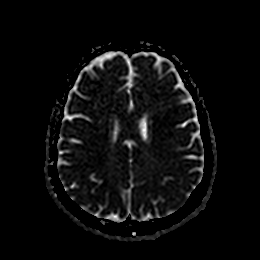
[im 49/49]
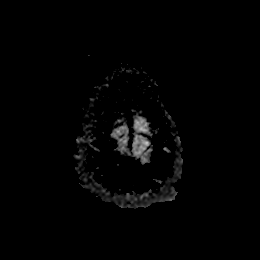

[Series 7: DWI · coronal · 4.0mm · 0.88mm/px · 5 of 64 slices shown (3 of 4)]
[im 1/64]
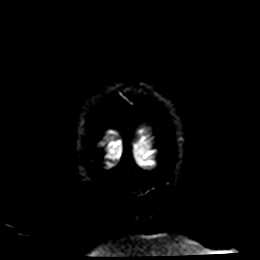
[im 16/64]
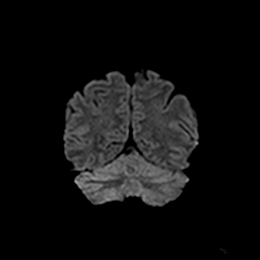
[im 32/64]
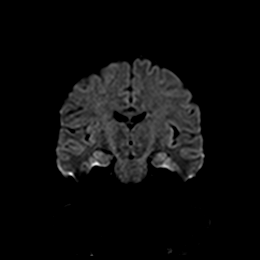
[im 48/64]
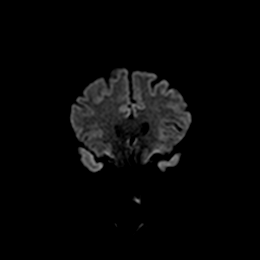
[im 64/64]
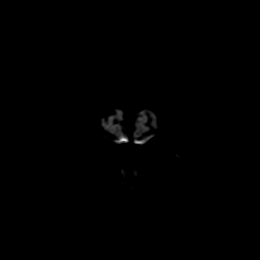

[Series 8: DWI · coronal · 4.0mm · 0.88mm/px · 3 of 32 slices shown (4 of 4)]
[im 1/32]
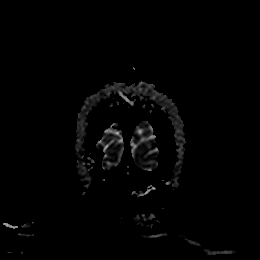
[im 16/32]
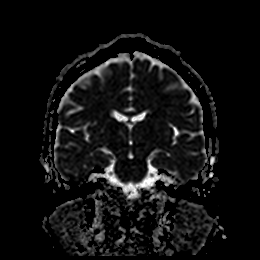
[im 32/32]
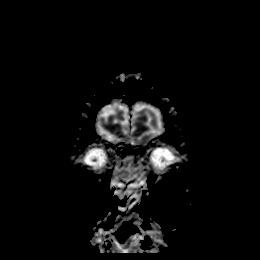

[Series 9: T1 · sagittal · 5.0mm · 0.75mm/px · 2 of 23 slices shown]
[im 1/23]
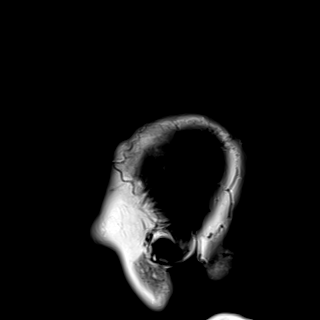
[im 23/23]
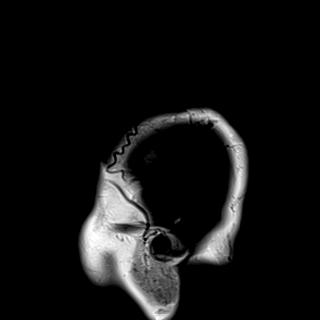

[Series 10: T2 · axial · 5.0mm · 0.72mm/px · z∈[-64,+80]mm · 2 of 25 slices shown (1 of 2)]
[im 1/25]
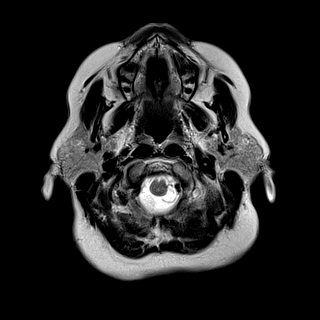
[im 25/25]
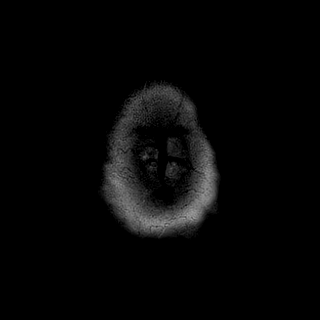

[Series 12: mag_images · axial · 3.0mm · 0.90mm/px · z∈[-75,+102]mm · 5 of 60 slices shown]
[im 1/60]
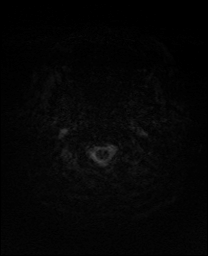
[im 15/60]
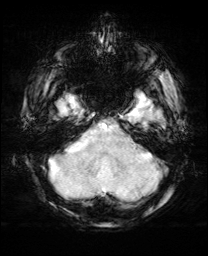
[im 30/60]
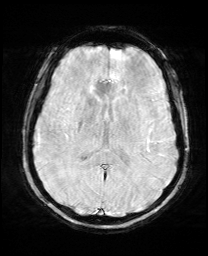
[im 45/60]
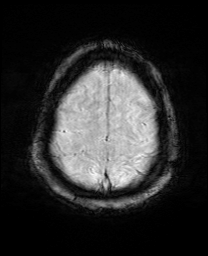
[im 60/60]
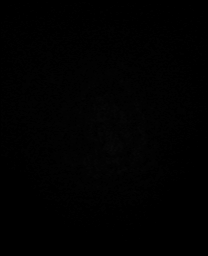

[Series 13: pha_images · axial · 3.0mm · 0.90mm/px · z∈[-75,+99]mm · 5 of 59 slices shown]
[im 1/59]
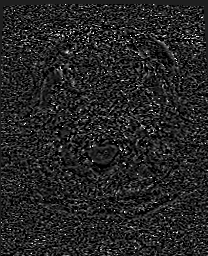
[im 15/59]
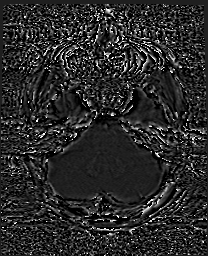
[im 30/59]
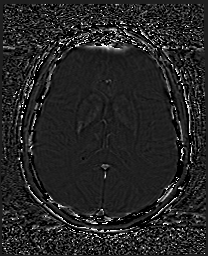
[im 44/59]
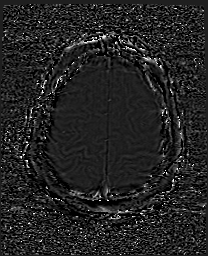
[im 59/59]
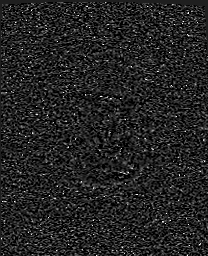

[Series 14: swi_images · axial · 3.0mm · 0.90mm/px · z∈[-75,+102]mm · 5 of 60 slices shown]
[im 1/60]
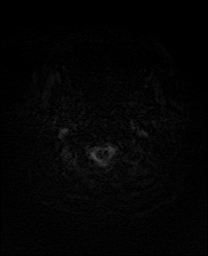
[im 15/60]
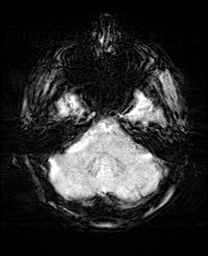
[im 30/60]
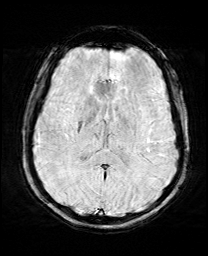
[im 45/60]
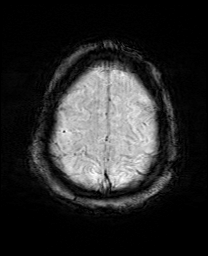
[im 60/60]
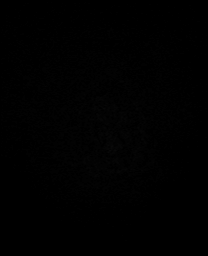

[Series 15: mip_images(sw) · axial · 24.0mm · 0.90mm/px · z∈[-64,+92]mm · 4 of 53 slices shown]
[im 1/53]
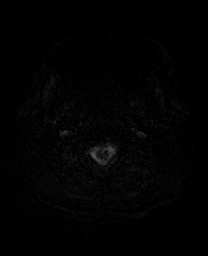
[im 18/53]
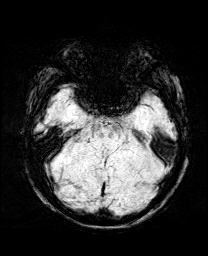
[im 35/53]
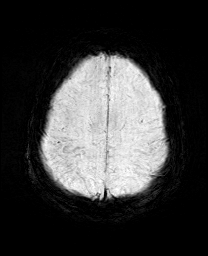
[im 53/53]
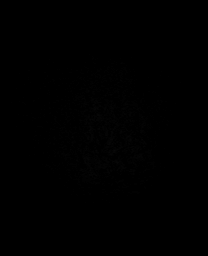

[Series 16: FLAIR · axial · 5.0mm · 0.90mm/px · z∈[-64,+79]mm · 2 of 25 slices shown (1 of 2)]
[im 1/25]
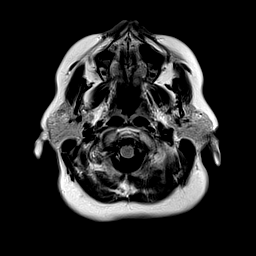
[im 25/25]
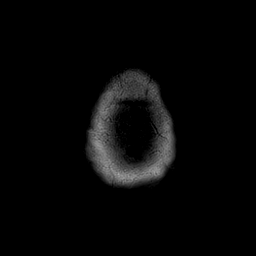

[Series 17: FLAIR · axial · 5.0mm · 0.90mm/px · z∈[-64,+80]mm · 2 of 25 slices shown (2 of 2)]
[im 1/25]
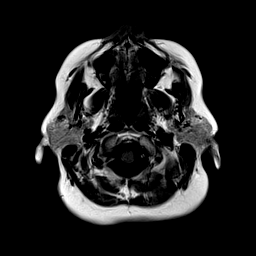
[im 25/25]
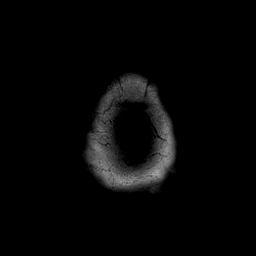

[Series 18: T2 · coronal · 5.0mm · 0.72mm/px · 2 of 27 slices shown (2 of 2)]
[im 1/27]
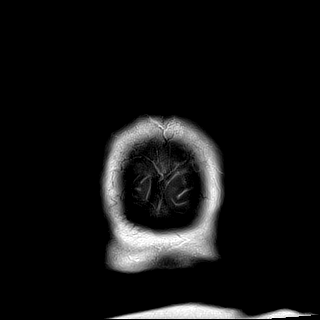
[im 27/27]
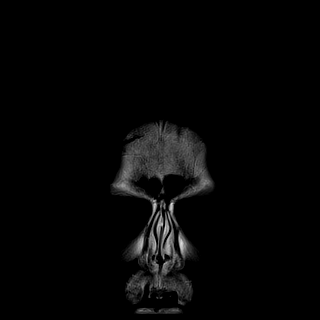

[48 of 48 positions shown; findings below may reference images not displayed]

FINDINGS: Brain: Cerebral volume within normal limits for patient age. Few
small remote lacunar infarcts noted within the right basal ganglia
and right thalamus.

No abnormal foci of restricted diffusion to suggest acute or
subacute ischemia. Gray-white matter differentiation well
maintained. No encephalomalacia to suggest chronic infarction. No
foci of susceptibility artifact to suggest acute or chronic
intracranial hemorrhage.

No mass lesion, midline shift or mass effect. No hydrocephalus. No
extra-axial fluid collection. Major dural sinuses are grossly
patent.

Pituitary gland no made of a partially empty sella. Midline
structures intact and normal.

Vascular: Major intracranial vascular flow voids well maintained and
normal in appearance.

Skull and upper cervical spine: Craniocervical junction normal.
Visualized upper cervical spine within normal limits. Bone marrow
signal intensity normal. No scalp soft tissue abnormality.

Sinuses/Orbits: Globes and orbital soft tissues within normal
limits.

Paranasal sinuses are clear. No mastoid effusion. Inner ear
structures normal.

Other: None.
IMPRESSION: 1. No acute intracranial infarct or other abnormality identified.
2. Few small remote lacunar infarcts involving the right basal
ganglia and right thalamus.
